# Patient Record
Sex: Female | Born: 1995 | Race: White | Hispanic: No | Marital: Single | Smoking: Never smoker
Health system: Southern US, Community
[De-identification: ages and names within clinical notes are randomized; demographics above are authoritative.]

## PROBLEM LIST (undated history)

## (undated) DIAGNOSIS — F419 Anxiety disorder, unspecified: Secondary | ICD-10-CM

## (undated) DIAGNOSIS — R569 Unspecified convulsions: Secondary | ICD-10-CM

## (undated) DIAGNOSIS — F32A Depression, unspecified: Secondary | ICD-10-CM

## (undated) DIAGNOSIS — F329 Major depressive disorder, single episode, unspecified: Secondary | ICD-10-CM

## (undated) HISTORY — DX: Unspecified convulsions: R56.9

## (undated) HISTORY — DX: Depression, unspecified: F32.A

## (undated) HISTORY — DX: Anxiety disorder, unspecified: F41.9

## (undated) HISTORY — PX: WISDOM TOOTH EXTRACTION: SHX21

---
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
## 2019-05-28 ENCOUNTER — Ambulatory Visit: Payer: BC Managed Care – PPO | Admitting: Family Medicine

## 2019-05-28 ENCOUNTER — Other Ambulatory Visit: Payer: Self-pay

## 2019-05-28 ENCOUNTER — Encounter: Payer: Self-pay | Admitting: Family Medicine

## 2019-05-28 VITALS — BP 123/85 | HR 77 | Temp 98.9°F | Ht 64.0 in | Wt 162.0 lb

## 2019-05-28 DIAGNOSIS — F418 Other specified anxiety disorders: Secondary | ICD-10-CM | POA: Diagnosis not present

## 2019-05-28 DIAGNOSIS — K625 Hemorrhage of anus and rectum: Secondary | ICD-10-CM

## 2019-05-28 MED ORDER — ESCITALOPRAM OXALATE 10 MG PO TABS: 10.0000 mg | ORAL_TABLET | Freq: Every day | ORAL | 1 refills | Status: DC

## 2019-05-28 NOTE — Patient Instructions (Signed)
° ° ° °  If you have lab work done today you will be contacted with your lab results within the next 2 weeks.  If you have not heard from us then please contact us. The fastest way to get your results is to register for My Chart. ° ° °IF you received an x-ray today, you will receive an invoice from [facility]. Please contact [facility] at [phone] with questions or concerns regarding your invoice.  ° °IF you received labwork today, you will receive an invoice from [facility]. Please contact [facility] at [phone] with questions or concerns regarding your invoice.  ° °Our billing staff will not be able to assist you with questions regarding bills from these companies. ° °You will be contacted with the lab results as soon as they are available. The fastest way to get your results is to activate your My Chart account. Instructions are located on the last page of this paperwork. If you have not heard from us regarding the results in 2 weeks, please contact this office. °  ° ° ° °

## 2019-05-28 NOTE — Progress Notes (Signed)
12/18/20201:32 PM  Marilyn Butler [birth year], 23 y.o., female [id number]  Chief Complaint  Patient presents with  . Depression    just moved here from [place], [place] from [place]. Looking to est care. Has depression, taking lexapro. Reports being on the med for about 4 months, beginning was rough with the medicationn now she's good  . Rectal Bleeding    starting to happen more often    HPI:   Patient is a 23 y.o. female with past medical history significant for depression who presents today to establish care  Moved recently from [place] to [place] for work Adjusting ok, has friend in area, boyfriend stationed in [place]  Started to go to therapy in college for anxiety and then started to identify depression being an issue Started on lexapro about 4 months ago She does not want to increase medication, has noted decreased empathy since starting lexapro Has not established with counseling yet Denies active SI  Rectal bleeding, minor pain, for past several months, bright red blood Frequent Has history of hemorrhoids BM overall are regular No nausea, vomiting, abd pain, bloating,  Weight changes, fever, chills, night sweats, no Fhx IBD or colon cancer Depression screen [facility] 2/9 05/28/2019  Decreased Interest 1  Down, Depressed, Hopeless 2  PHQ - 2 Score 3  Altered sleeping 2  Tired, decreased energy 2  Change in appetite 2  Feeling bad or failure about yourself  2  Trouble concentrating 2  Moving slowly or fidgety/restless 2  Suicidal thoughts 1  PHQ-9 Score 16  Difficult doing work/chores Somewhat difficult    No flowsheet data found.   Not on File  Prior to Admission medications   Medication Sig Start Date End Date Taking? Authorizing Provider  escitalopram (LEXAPRO) 10 MG tablet Take 10 mg by mouth daily.   Yes [provider]  etonogestrel (NEXPLANON) 68 MG IMPL implant 1 each by Subdermal route once.   Yes [provider]    Past Medical  History:  Diagnosis Date  . Anxiety   . Depression     History reviewed. No pertinent surgical history.  Social History   Tobacco Use  . Smoking status: Never Smoker  . Smokeless tobacco: Never Used  Substance Use Topics  . Alcohol use: Yes    Family History  Problem Relation Age of Onset  . Mental illness Mother   . Diabetes Father   . Hypertension Father   . Mental illness Sister     Review of Systems  Constitutional: Negative for chills, fever and malaise/fatigue.  Respiratory: Negative for cough and shortness of breath.   Cardiovascular: Negative for chest pain, palpitations and leg swelling.  Gastrointestinal: Positive for blood in stool. Negative for abdominal pain, melena, nausea and vomiting.  Neurological: Negative for dizziness and headaches.   Per hpi  OBJECTIVE:  Today's Vitals   05/28/19 1328  BP: 123/85  Pulse: 77  Temp: 98.9 F (37.2 C)  Weight: 162 lb (73.5 kg)  Height: 5' 4"  (1.626 m)   Body mass index is 27.81 kg/m.   Physical Exam Vitals and nursing note reviewed. Exam conducted with a chaperone present.  Constitutional:      Appearance: She is well-developed.  HENT:     Head: Normocephalic and atraumatic.     Mouth/Throat:     Pharynx: No oropharyngeal exudate.  Eyes:     General: No scleral icterus.    Conjunctiva/sclera: Conjunctivae normal.     Pupils: Pupils are equal, round,  and reactive to light.  Cardiovascular:     Rate and Rhythm: Normal rate and regular rhythm.     Heart sounds: Normal heart sounds. No murmur. No friction rub. No gallop.   Pulmonary:     Effort: Pulmonary effort is normal.     Breath sounds: Normal breath sounds. No wheezing, rhonchi or rales.  Abdominal:     General: Bowel sounds are normal. There is no distension.     Palpations: Abdomen is soft. There is no hepatomegaly, splenomegaly or mass.     Tenderness: There is abdominal tenderness in the right upper quadrant. There is no guarding or rebound.  Negative signs include Murphy's sign.  Genitourinary:    Rectum: No mass, tenderness, anal fissure, external hemorrhoid or internal hemorrhoid. Normal anal tone.  Musculoskeletal:     Cervical back: Neck supple.  Skin:    General: Skin is warm and dry.  Neurological:     Mental Status: She is alert and oriented to person, place, and time.     No results found for this or any previous visit (from the past 24 hour(s)).  No results found.   ASSESSMENT and PLAN  1. Depression with anxiety Improved per patient but not controlled. Patient at this time does not want to increase medication, will establish with counseling. RTC precautions given.  2. Rectal bleeding Unremarkable rectal exam. Referring to GI for further eval and treatment - CBC - Comprehensive metabolic panel - Ambulatory referral to Gastroenterology  Other orders - escitalopram (LEXAPRO) 10 MG tablet; Take 1 tablet (10 mg total) by mouth daily.  Return in about 3 months (around 08/26/2019).    Rutherford Guys, MD [facility] [place], [place] [address] Ph.  [phone] Fax [phone]

## 2019-05-29 LAB — CBC
Hematocrit: 43.6 % (ref 34.0–46.6)
Hemoglobin: 14.7 g/dL (ref 11.1–15.9)
MCH: 32.4 pg (ref 26.6–33.0)
MCHC: 33.7 g/dL (ref 31.5–35.7)
MCV: 96 fL (ref 79–97)
Platelets: 247 10*3/uL (ref 150–450)
RBC: 4.54 x10E6/uL (ref 3.77–5.28)
RDW: 12.5 % (ref 11.7–15.4)
WBC: 7.8 10*3/uL (ref 3.4–10.8)

## 2019-05-29 LAB — COMPREHENSIVE METABOLIC PANEL
ALT: 16 IU/L (ref 0–32)
AST: 17 IU/L (ref 0–40)
Albumin/Globulin Ratio: 2.2 (ref 1.2–2.2)
Albumin: 4.9 g/dL (ref 3.9–5.0)
Alkaline Phosphatase: 96 IU/L (ref 39–117)
BUN/Creatinine Ratio: 19 (ref 9–23)
BUN: 15 mg/dL (ref 6–20)
Bilirubin Total: 0.4 mg/dL (ref 0.0–1.2)
CO2: 23 mmol/L (ref 20–29)
Calcium: 9.3 mg/dL (ref 8.7–10.2)
Chloride: 102 mmol/L (ref 96–106)
Creatinine, Ser: 0.78 mg/dL (ref 0.57–1.00)
GFR calc Af Amer: 123 mL/min/{1.73_m2} (ref >59)
GFR calc non Af Amer: 107 mL/min/{1.73_m2} (ref >59)
Globulin, Total: 2.2 g/dL (ref 1.5–4.5)
Glucose: 84 mg/dL (ref 65–99)
Potassium: 4.2 mmol/L (ref 3.5–5.2)
Sodium: 139 mmol/L (ref 134–144)
Total Protein: 7.1 g/dL (ref 6.0–8.5)

## 2019-06-14 DIAGNOSIS — F329 Major depressive disorder, single episode, unspecified: Secondary | ICD-10-CM | POA: Insufficient documentation

## 2019-06-14 DIAGNOSIS — K625 Hemorrhage of anus and rectum: Secondary | ICD-10-CM | POA: Insufficient documentation

## 2019-06-14 DIAGNOSIS — F32A Depression, unspecified: Secondary | ICD-10-CM | POA: Insufficient documentation

## 2019-06-23 ENCOUNTER — Ambulatory Visit: Payer: Self-pay | Admitting: Physician Assistant

## 2019-06-30 ENCOUNTER — Encounter: Payer: Self-pay | Admitting: Obstetrics & Gynecology

## 2019-06-30 ENCOUNTER — Ambulatory Visit: Payer: BC Managed Care – PPO | Admitting: Obstetrics & Gynecology

## 2019-06-30 ENCOUNTER — Other Ambulatory Visit: Payer: Self-pay

## 2019-06-30 VITALS — BP 122/79 | HR 76 | Wt 164.7 lb

## 2019-06-30 DIAGNOSIS — Z23 Encounter for immunization: Secondary | ICD-10-CM

## 2019-06-30 DIAGNOSIS — Z01419 Encounter for gynecological examination (general) (routine) without abnormal findings: Secondary | ICD-10-CM

## 2019-06-30 NOTE — Patient Instructions (Signed)
Preventive Care 21-25 Years Old, Female Preventive care refers to visits with your health care provider and lifestyle choices that can promote health and wellness. This includes:  A yearly physical exam. This may also be called an annual well check.  Regular dental visits and eye exams.  Immunizations.  Screening for certain conditions.  Healthy lifestyle choices, such as eating a healthy diet, getting regular exercise, not using drugs or products that contain nicotine and tobacco, and limiting alcohol use. What can I expect for my preventive care visit? Physical exam Your health care provider will check your:  Height and weight. This may be used to calculate body mass index (BMI), which tells if you are at a healthy weight.  Heart rate and blood pressure.  Skin for abnormal spots. Counseling Your health care provider may ask you questions about your:  Alcohol, tobacco, and drug use.  Emotional well-being.  Home and relationship well-being.  Sexual activity.  Eating habits.  Work and work environment.  Method of birth control.  Menstrual cycle.  Pregnancy history. What immunizations do I need?  Influenza (flu) vaccine  This is recommended every year. Tetanus, diphtheria, and pertussis (Tdap) vaccine  You may need a Td booster every 10 years. Varicella (chickenpox) vaccine  You may need this if you have not been vaccinated. Human papillomavirus (HPV) vaccine  If recommended by your health care provider, you may need three doses over 6 months. Measles, mumps, and rubella (MMR) vaccine  You may need at least one dose of MMR. You may also need a second dose. Meningococcal conjugate (MenACWY) vaccine  One dose is recommended if you are age 19-21 years and a first-year college student living in a residence hall, or if you have one of several medical conditions. You may also need additional booster doses. Pneumococcal conjugate (PCV13) vaccine  You may need  this if you have certain conditions and were not previously vaccinated. Pneumococcal polysaccharide (PPSV23) vaccine  You may need one or two doses if you smoke cigarettes or if you have certain conditions. Hepatitis A vaccine  You may need this if you have certain conditions or if you travel or work in places where you may be exposed to hepatitis A. Hepatitis B vaccine  You may need this if you have certain conditions or if you travel or work in places where you may be exposed to hepatitis B. Haemophilus influenzae type b (Hib) vaccine  You may need this if you have certain conditions. You may receive vaccines as individual doses or as more than one vaccine together in one shot (combination vaccines). Talk with your health care provider about the risks and benefits of combination vaccines. What tests do I need?  Blood tests  Lipid and cholesterol levels. These may be checked every 5 years starting at age 20.  Hepatitis C test.  Hepatitis B test. Screening  Diabetes screening. This is done by checking your blood sugar (glucose) after you have not eaten for a while (fasting).  Sexually transmitted disease (STD) testing.  BRCA-related cancer screening. This may be done if you have a family history of breast, ovarian, tubal, or peritoneal cancers.  Pelvic exam and Pap test. This may be done every 3 years starting at age 21. Starting at age 30, this may be done every 5 years if you have a Pap test in combination with an HPV test. Talk with your health care provider about your test results, treatment options, and if necessary, the need for more tests.   Follow these instructions at home: Eating and drinking   Eat a diet that includes fresh fruits and vegetables, whole grains, lean protein, and low-fat dairy.  Take vitamin and mineral supplements as recommended by your health care provider.  Do not drink alcohol if: ? Your health care provider tells you not to drink. ? You are  pregnant, may be pregnant, or are planning to become pregnant.  If you drink alcohol: ? Limit how much you have to 0-1 drink a day. ? Be aware of how much alcohol is in your drink. In the [place] one drink equals one 12 oz bottle of beer (355 mL), one 5 oz glass of wine (148 mL), or one 1 oz glass of hard liquor (44 mL). Lifestyle  Take daily care of your teeth and gums.  Stay active. Exercise for at least 30 minutes on 5 or more days each week.  Do not use any products that contain nicotine or tobacco, such as cigarettes, e-cigarettes, and chewing tobacco. If you need help quitting, ask your health care provider.  If you are sexually active, practice safe sex. Use a condom or other form of birth control (contraception) in order to prevent pregnancy and STIs (sexually transmitted infections). If you plan to become pregnant, see your health care provider for a preconception visit. What's next?  Visit your health care provider once a year for a well check visit.  Ask your health care provider how often you should have your eyes and teeth checked.  Stay up to date on all vaccines. This information is not intended to replace advice given to you by your health care provider. Make sure you discuss any questions you have with your health care provider. Document Revised: 02/05/2018 Document Reviewed: 02/05/2018 Elsevier Patient Education  2020 [employer].

## 2019-06-30 NOTE — Progress Notes (Signed)
Nexplanon placed February 2020 per pt. Last PAP smear January 2020; normal per pt. Positive GAD-7; offered [facility] services, pt states she recently started with a new counselor and medication is managed by PCP.  Apolonio Schneiders RN 06/30/19

## 2019-06-30 NOTE — Progress Notes (Signed)
GYNECOLOGY ANNUAL PREVENTATIVE CARE ENCOUNTER NOTE  History:     Marilyn Butler is a 24 y.o. G7 female here for a routine annual gynecologic exam and to establish care.  Current significant complaints: left lower abdominal pain noticed on palpation of area during exam, no pain at rest.   Denies abnormal vaginal bleeding, discharge, other pelvic pain, problems with intercourse or other gynecologic concerns.    Gynecologic History Patient's last menstrual period was 04/10/2019 (approximate). Contraception: Nexplanon placed February 2020 Last Pap: January 2020. Results were: normal   Obstetric History OB History  Gravida Para Term Preterm AB Living  0 0 0 0 0 0  SAB TAB Ectopic Multiple Live Births  0 0 0 0 0    Past Medical History:  Diagnosis Date  . Anxiety   . Depression     History reviewed. No pertinent surgical history.  Current Outpatient Medications on File Prior to Visit  Medication Sig Dispense Refill  . escitalopram (LEXAPRO) 10 MG tablet Take 1 tablet (10 mg total) by mouth daily. 90 tablet 1  . etonogestrel (NEXPLANON) 68 MG IMPL implant 1 each by Subdermal route once.    Marland Kitchen ibuprofen (ADVIL) 400 MG tablet Take 400 mg by mouth every 6 (six) hours as needed.     No current facility-administered medications on file prior to visit.    Not on File  Social History:  reports that she has never smoked. She has never used smokeless tobacco. She reports current alcohol use. She reports that she does not use drugs.  Family History  Problem Relation Age of Onset  . Mental illness Mother   . Diabetes Father   . Hypertension Father   . Mental illness Sister     The following portions of the patient's history were reviewed and updated as appropriate: allergies, current medications, past family history, past medical history, past social history, past surgical history and problem list.  Review of Systems Pertinent items noted in HPI and remainder of comprehensive ROS  otherwise negative.  Physical Exam:  BP 122/79   Pulse 76   Wt 164 lb 11.2 oz (74.7 kg)   LMP 04/10/2019 (Approximate)   BMI 28.27 kg/m  CONSTITUTIONAL: Well-developed, well-nourished female in no acute distress.  HENT:  Normocephalic, atraumatic, External right and left ear normal. Oropharynx is clear and moist EYES: Conjunctivae and EOM are normal. Pupils are equal, round, and reactive to light. No scleral icterus.  NECK: Normal range of motion, supple, no masses.  Normal thyroid.  SKIN: Skin is warm and dry. No rash noted. Not diaphoretic. No erythema. No pallor. MUSCULOSKELETAL: Normal range of motion. No tenderness.  No cyanosis, clubbing, or edema.  2+ distal pulses. NEUROLOGIC: Alert and oriented to person, place, and time. Normal reflexes, muscle tone coordination.  PSYCHIATRIC: Normal mood and affect. Normal behavior. Normal judgment and thought content. CARDIOVASCULAR: Normal heart rate noted, regular rhythm RESPIRATORY: Clear to auscultation bilaterally. Effort and breath sounds normal, no problems with respiration noted. BREASTS: Symmetric in size. No masses, tenderness, skin changes, nipple drainage, or lymphadenopathy bilaterally. ABDOMEN: Soft, no distention noted.  Mild left lower abdominal tenderness to palpation, no rebound or guarding.  PELVIC: Deferred   Assessment and Plan:      1. Need for influenza vaccination - Flu Vaccine QUAD 36+ mos IM done  2. Well woman exam with routine gynecological exam Up-to-date on pap smear Normal breast exam She was told to call for any worsening pelvic pain or other gynecologic  concerns; she declined evaluation with infection screening or ultrasound for now. Routine preventative health maintenance measures emphasized. Please refer to After Visit Summary for other counseling recommendations.      Verita Schneiders, MD, [facility] [employer], [place]

## 2019-07-01 ENCOUNTER — Ambulatory Visit (INDEPENDENT_AMBULATORY_CARE_PROVIDER_SITE_OTHER): Payer: BC Managed Care – PPO | Admitting: Physician Assistant

## 2019-07-01 ENCOUNTER — Encounter: Payer: Self-pay | Admitting: Physician Assistant

## 2019-07-01 VITALS — BP 108/68 | HR 88 | Temp 98.7°F | Ht 63.75 in | Wt 165.5 lb

## 2019-07-01 DIAGNOSIS — R112 Nausea with vomiting, unspecified: Secondary | ICD-10-CM

## 2019-07-01 DIAGNOSIS — Z01818 Encounter for other preprocedural examination: Secondary | ICD-10-CM | POA: Diagnosis not present

## 2019-07-01 DIAGNOSIS — R109 Unspecified abdominal pain: Secondary | ICD-10-CM | POA: Diagnosis not present

## 2019-07-01 DIAGNOSIS — K625 Hemorrhage of anus and rectum: Secondary | ICD-10-CM | POA: Diagnosis not present

## 2019-07-01 MED ORDER — NA SULFATE-K SULFATE-MG SULF 17.5-3.13-1.6 GM/177ML PO SOLN: 1.0000 | Freq: Once | ORAL | 0 refills | Status: AC

## 2019-07-01 NOTE — Patient Instructions (Signed)
If you are age 25 or older, your body mass index should be between 23-30. Your Body mass index is 28.63 kg/m. If this is out of the aforementioned range listed, please consider follow up with your Primary Care Provider.  If you are age 29 or younger, your body mass index should be between 19-25. Your Body mass index is 28.63 kg/m. If this is out of the aformentioned range listed, please consider follow up with your Primary Care Provider.   You have been scheduled for a colonoscopy. Please follow written instructions given to you at your visit today.  Please pick up your prep supplies at the pharmacy within the next 1-3 days. If you use inhalers (even only as needed), please bring them with you on the day of your procedure.

## 2019-07-01 NOTE — Progress Notes (Signed)
Chief Complaint: Rectal bleeding  HPI:    Marilyn Butler is a 24 year old female with a past medical history as listed below including anxiety and depression, who was referred to me by Rutherford Guys, MD for a complaint of rectal bleeding.      05/28/2019 patient saw PCP and describes some rectal bleeding which was starting to happen more often.  Rectal exam at that time was unremarkable and patient was referred to our clinic.  Labs that day showed a CBC which was normal and a CMP which was normal.    Today, the patient explains that over the past couple of months she has started to notice some bloody stool and tells me this is "not just a little bit of bright red blood seen in the toilet paper".  She describes this mixed in with a solid stool most of the time when it occurs, when it happens it can last for a few days but it is very sporadic.  In fact, she has not seen any since a day after her appointment with PCP as above.  Denies any rectal pain during these episodes.    Along with above patient describes some lower abdominal pain, apparently saw OB/GYN yesterday who thought this may be due to ovulation.  Tells me she gets this pain off and on.  Describes a sister with IBS.    Also describes some random episodes of nausea and vomiting with diarrhea.  Tells me that every 2 or 3 months she will go to sleep and an hour after being asleep will wake back up nauseous, run to the bathroom and vomit and have an episode of diarrhea and then go back to sleep.  She has never kept a food journal or noticed if this is after eating late at night.  She is a vegetarian.    Works as a Corporate treasurer.    Denies fever, chills, weight loss, heartburn or reflux.  Past Medical History:  Diagnosis Date  . Anxiety   . Depression     No past surgical history on file.  Current Outpatient Medications  Medication Sig Dispense Refill  . escitalopram (LEXAPRO) 10 MG tablet Take 1 tablet (10 mg total) by mouth daily. 90  tablet 1  . etonogestrel (NEXPLANON) 68 MG IMPL implant 1 each by Subdermal route once.    Marland Kitchen ibuprofen (ADVIL) 400 MG tablet Take 400 mg by mouth every 6 (six) hours as needed.     No current facility-administered medications for this visit.    Allergies as of 07/01/2019  . (Not on File)    Family History  Problem Relation Age of Onset  . Mental illness Mother   . Diabetes Father   . Hypertension Father   . Mental illness Sister     Social History   Socioeconomic History  . Marital status: Single    Spouse name: Not on file  . Number of children: Not on file  . Years of education: Not on file  . Highest education level: Not on file  Occupational History  . Not on file  Tobacco Use  . Smoking status: Never Smoker  . Smokeless tobacco: Never Used  Substance and Sexual Activity  . Alcohol use: Yes  . Drug use: Never  . Sexual activity: Yes    Birth control/protection: Implant  Other Topics Concern  . Not on file  Social History Narrative  . Not on file   Social Determinants of Health   Financial Resource  Strain:   . Difficulty of Paying Living Expenses: Not on file  Food Insecurity:   . Worried About Charity fundraiser in the Last Year: Not on file  . Ran Out of Food in the Last Year: Not on file  Transportation Needs:   . Lack of Transportation (Medical): Not on file  . Lack of Transportation (Non-Medical): Not on file  Physical Activity:   . Days of Exercise per Week: Not on file  . Minutes of Exercise per Session: Not on file  Stress:   . Feeling of Stress : Not on file  Social Connections:   . Frequency of Communication with Friends and Family: Not on file  . Frequency of Social Gatherings with Friends and Family: Not on file  . Attends Religious Services: Not on file  . Active Member of Clubs or Organizations: Not on file  . Attends Archivist Meetings: Not on file  . Marital Status: Not on file  Intimate Partner Violence:   . Fear of  Current or Ex-Partner: Not on file  . Emotionally Abused: Not on file  . Physically Abused: Not on file  . Sexually Abused: Not on file    Review of Systems:    Constitutional: No weight loss, fever or chills Skin: No rash  Cardiovascular: No chest pain Respiratory: No SOB  Gastrointestinal: See HPI and otherwise negative Genitourinary: No dysuria Neurological: No headache Musculoskeletal: No new muscle or joint pain Hematologic: No bruising Psychiatric: +anxiety and depression   Physical Exam:  Vital signs: BP 108/68 (BP Location: Left Arm, Patient Position: Sitting, Cuff Size: Normal)   Pulse 88   Temp 98.7 F (37.1 C)   Ht 5' 3.75" (1.619 m) Comment: height measured without shoes  Wt 165 lb 8 oz (75.1 kg)   BMI 28.63 kg/m   Constitutional:   Pleasant Caucasian female appears to be in NAD, Well developed, Well nourished, alert and cooperative Head:  Normocephalic and atraumatic. Eyes:   PEERL, EOMI. No icterus. Conjunctiva pink. Ears:  Normal auditory acuity. Neck:  Supple Throat: Oral cavity and pharynx without inflammation, swelling or lesion.  Respiratory: Respirations even and unlabored. Lungs clear to auscultation bilaterally.   No wheezes, crackles, or rhonchi.  Cardiovascular: Normal S1, S2. No MRG. Regular rate and rhythm. No peripheral edema, cyanosis or pallor.  Gastrointestinal:  Soft, nondistended, Mild RLQ ttp. No rebound or guarding. Normal bowel sounds. No appreciable masses or hepatomegaly. Rectal:  Not performed.  Msk:  Symmetrical without gross deformities. Without edema, no deformity or joint abnormality.  Neurologic:  Alert and  oriented x4;  grossly normal neurologically.  Skin:   Dry and intact without significant lesions or rashes. Psychiatric: Demonstrates good judgement and reason without abnormal affect or behaviors.  MOST RECENT LABS AND IMAGING: CBC    Component Value Date/Time   WBC 7.8 05/28/2019 1429   RBC 4.54 05/28/2019 1429   HGB  14.7 05/28/2019 1429   HCT 43.6 05/28/2019 1429   PLT 247 05/28/2019 1429   MCV 96 05/28/2019 1429   MCH 32.4 05/28/2019 1429   MCHC 33.7 05/28/2019 1429   RDW 12.5 05/28/2019 1429    CMP     Component Value Date/Time   NA 139 05/28/2019 1429   K 4.2 05/28/2019 1429   CL 102 05/28/2019 1429   CO2 23 05/28/2019 1429   GLUCOSE 84 05/28/2019 1429   BUN 15 05/28/2019 1429   CREATININE 0.78 05/28/2019 1429   CALCIUM 9.3 05/28/2019 1429  PROT 7.1 05/28/2019 1429   ALBUMIN 4.9 05/28/2019 1429   AST 17 05/28/2019 1429   ALT 16 05/28/2019 1429   ALKPHOS 96 05/28/2019 1429   BILITOT 0.4 05/28/2019 1429   GFRNONAA 107 05/28/2019 1429   GFRAA 123 05/28/2019 1429    Assessment: 1.  Hematochezia: Can occur for a few days at a time, has not happened in the past month, rectal exam at time of last bleeding was unrevealing per PCP; consider hemorrhoids versus IBD versus other 2.  Abdominal pain: At random times per patient, thought possibly ovulation by OB/GYN; consider ovulation versus IBS 3.  Nausea and vomiting: Occurs every 2 to 3 months an hour after patient goes to sleep to wake up and have an episode of nausea and vomiting with diarrhea; consider most likely gastritis/reflux versus cyclical vomiting  Plan: 1.  Discussed doing an anoscopy with the patient today.  She declines. 2.  Recommend the patient have a colonoscopy for further evaluation of her abdominal pain and hematochezia.  This was scheduled with Dr. Loletha Carrow as he is supervising this afternoon in the [facility].  Did discuss risks, benefits, limitations and alternatives and the patient agrees to proceed.  She will be Covid tested 2 days prior to time of procedure. 3.  Discussed nausea and vomiting episodes with the patient, would recommend that she keep a food journal for [place] to better decide what is causing these episodes as they only occur every 2 to 3 months.  Did discuss the possibility of reflux.  Reviewed antireflux lifestyle  modifications and diet recommendations including waiting 3 to 4 hours after eating before laying down at night. 4.  Patient will follow in clinic per recommendations from Dr. Loletha Carrow after time of procedure.  Ellouise Newer, PA-C [facility] 07/01/2019, 2:31 PM  Cc: Rutherford Guys, MD

## 2019-07-05 NOTE — Progress Notes (Signed)
____________________________________________________________  Attending physician addendum:  Thank you for sending this case to me. I have reviewed the entire note, and the outlined plan seems appropriate.  Difficult to characterize symptoms.  Wilfrid Lund, MD  ____________________________________________________________

## 2019-07-16 DIAGNOSIS — F419 Anxiety disorder, unspecified: Secondary | ICD-10-CM | POA: Diagnosis not present

## 2019-07-26 ENCOUNTER — Other Ambulatory Visit: Payer: Self-pay | Admitting: Gastroenterology

## 2019-07-26 ENCOUNTER — Ambulatory Visit (INDEPENDENT_AMBULATORY_CARE_PROVIDER_SITE_OTHER): Payer: BC Managed Care – PPO

## 2019-07-26 ENCOUNTER — Encounter: Payer: Self-pay | Admitting: Gastroenterology

## 2019-07-26 DIAGNOSIS — Z1159 Encounter for screening for other viral diseases: Secondary | ICD-10-CM | POA: Diagnosis not present

## 2019-07-27 LAB — SARS CORONAVIRUS 2 (TAT 6-24 HRS): SARS Coronavirus 2: NEGATIVE

## 2019-07-28 ENCOUNTER — Ambulatory Visit (AMBULATORY_SURGERY_CENTER): Payer: BC Managed Care – PPO | Admitting: Gastroenterology

## 2019-07-28 ENCOUNTER — Other Ambulatory Visit: Payer: Self-pay

## 2019-07-28 ENCOUNTER — Encounter: Payer: Self-pay | Admitting: Gastroenterology

## 2019-07-28 VITALS — BP 117/69 | HR 67 | Temp 96.2°F | Resp 19 | Ht 63.75 in | Wt 165.0 lb

## 2019-07-28 DIAGNOSIS — K625 Hemorrhage of anus and rectum: Secondary | ICD-10-CM

## 2019-07-28 DIAGNOSIS — R109 Unspecified abdominal pain: Secondary | ICD-10-CM

## 2019-07-28 DIAGNOSIS — K633 Ulcer of intestine: Secondary | ICD-10-CM

## 2019-07-28 MED ORDER — SODIUM CHLORIDE 0.9 % IV SOLN: 500.0000 mL | Freq: Once | INTRAVENOUS | Status: AC

## 2019-07-28 NOTE — Progress Notes (Signed)
Called to room to assist during endoscopic procedure.  Patient ID and intended procedure confirmed with present staff. Received instructions for my participation in the procedure from the performing physician.  

## 2019-07-28 NOTE — Progress Notes (Signed)
To PACU, VSS. Report to [email or web address]

## 2019-07-28 NOTE — Op Note (Signed)
[place] Patient Name: Marilyn Butler Procedure Date: 07/28/2019 12:24 PM MRN: [id number] Endoscopist: Menno. Loletha Carrow , MD Age: 24 Referring MD:  Date of Birth: 31-Dec-1995 Gender: Female Account #: [id number] Procedure:                Colonoscopy Indications:              Rectal bleeding (intermittent, approximately 6                            months) Medicines:                Monitored Anesthesia Care Procedure:                Pre-Anesthesia Assessment:                           - Prior to the procedure, a History and Physical                            was performed, and patient medications and                            allergies were reviewed. The patient's tolerance of                            previous anesthesia was also reviewed. The risks                            and benefits of the procedure and the sedation                            options and risks were discussed with the patient.                            All questions were answered, and informed consent                            was obtained. Prior Anticoagulants: The patient has                            taken no previous anticoagulant or antiplatelet                            agents. ASA Grade Assessment: II - A patient with                            mild systemic disease. After reviewing the risks                            and benefits, the patient was deemed in                            satisfactory condition to undergo the procedure.  After obtaining informed consent, the colonoscope                            was passed under direct vision. Throughout the                            procedure, the patient's blood pressure, pulse, and                            oxygen saturations were monitored continuously. The                            Colonoscope was introduced through the anus and                            advanced to the the terminal ileum, with            identification of the appendiceal orifice and IC                            valve. The colonoscopy was performed without                            difficulty. The patient tolerated the procedure                            well. The quality of the bowel preparation was                            good. The terminal ileum, ileocecal valve,                            appendiceal orifice, and rectum were photographed. Scope In: 12:37:30 PM Scope Out: 12:48:43 PM Scope Withdrawal Time: 0 hours 7 minutes 48 seconds  Total Procedure Duration: 0 hours 11 minutes 13 seconds  Findings:                 The perianal and digital rectal examinations were                            normal. Specifically, no fistula seen.                           The terminal ileum contained multiple patchy                            non-bleeding aphthous ulcers with inflammation.                            Biopsies were taken with a cold forceps for                            histology.                           The entire examined  colon appeared normal on direct                            and retroflexion views. Complications:            No immediate complications. Estimated Blood Loss:     Estimated blood loss: none. Estimated blood loss                            was minimal. 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 Biopsied.                           - The entire examined colon is normal on direct and                            retroflexion views.                           The ileal findings appear more like NSAID effect                            than Crohn's disease, and are not sufficient to                            explain overt lower GI bleeding. Benign anal                            bleeding. Recommendation:           - Patient has a contact number available for                            emergencies. The signs and symptoms of potential                            delayed  complications were discussed with the                            patient. Return to normal activities tomorrow.                            Written discharge instructions were provided to the                            patient.                           - Resume previous diet.                           - Continue present medications.                           - Await pathology results. Depending on results, CT  enterography may be warranted. Charizma Gardiner L. Loletha Carrow, MD 07/28/2019 12:59:17 PM This report has been signed electronically.

## 2019-07-28 NOTE — Progress Notes (Signed)
Temp-JB VS-DT

## 2019-07-28 NOTE — Patient Instructions (Signed)
YOU HAD AN ENDOSCOPIC PROCEDURE TODAY AT [facility]:   Refer to the procedure report that was given to you for any specific questions about what was found during the examination.  If the procedure report does not answer your questions, please call your gastroenterologist to clarify.  If you requested that your care partner not be given the details of your procedure findings, then the procedure report has been included in a sealed envelope for you to review at your convenience later.  YOU SHOULD EXPECT: Some feelings of bloating in the abdomen. Passage of more gas than usual.  Walking can help get rid of the air that was put into your GI tract during the procedure and reduce the bloating. If you had a lower endoscopy (such as a colonoscopy or flexible sigmoidoscopy) you may notice spotting of blood in your stool or on the toilet paper. If you underwent a bowel prep for your procedure, you may not have a normal bowel movement for a few days.  Please Note:  You might notice some irritation and congestion in your nose or some drainage.  This is from the oxygen used during your procedure.  There is no need for concern and it should clear up in a day or so.  SYMPTOMS TO REPORT IMMEDIATELY:   Following lower endoscopy (colonoscopy or flexible sigmoidoscopy):  Excessive amounts of blood in the stool  Significant tenderness or worsening of abdominal pains  Swelling of the abdomen that is new, acute  Fever of 100F or higher   For urgent or emergent issues, a gastroenterologist can be reached at any hour by calling [phone].   DIET:  We do recommend a small meal at first, but then you may proceed to your regular diet.  Drink plenty of fluids but you should avoid alcoholic beverages for 24 hours.  MEDICATIONS: Continue present medications.  FOLLOW UP: We will await your pathology results. Depending on these results, CT enterography may be warranted.  Please see handouts given to  you by your recovery nurse.  ACTIVITY:  You should plan to take it easy for the rest of today and you should NOT DRIVE or use heavy machinery until tomorrow (because of the sedation medicines used during the test).    FOLLOW UP: Our staff will call the number listed on your records 48-72 hours following your procedure to check on you and address any questions or concerns that you may have regarding the information given to you following your procedure. If we do not reach you, we will leave a message.  We will attempt to reach you two times.  During this call, we will ask if you have developed any symptoms of COVID 19. If you develop any symptoms (ie: fever, flu-like symptoms, shortness of breath, cough etc.) before then, please call [phone].  If you test positive for Covid 19 in the 2 weeks post procedure, please call and report this information to [place].    If any biopsies were taken you will be contacted by phone or by letter within the next 1-3 weeks.  Please call us at [phone] if you have not heard about the biopsies in 3 weeks.   Thank you for allowing [place] to provide for your healthcare needs today.   SIGNATURES/CONFIDENTIALITY: You and/or your care partner have signed paperwork which will be entered into your electronic medical record.  These signatures attest to the fact that that the information above on your After Visit Summary has been  reviewed and is understood.  Full responsibility of the confidentiality of this discharge information lies with you and/or your care-partner.

## 2019-07-30 ENCOUNTER — Telehealth: Payer: Self-pay | Admitting: *Deleted

## 2019-07-30 NOTE — Telephone Encounter (Signed)
  Follow up Call-  Call back number 07/28/2019  Post procedure Call Back phone  # [phone]  Permission to leave phone message Yes     Patient questions:  Do you have a fever, pain , or abdominal swelling? No. Pain Score  0 *  Have you tolerated food without any problems? Yes.    Have you been able to return to your normal activities? Yes.    Do you have any questions about your discharge instructions: Diet   No. Medications  No. Follow up visit  No.  Do you have questions or concerns about your Care? No.  Actions: * If pain score is 4 or above: No action needed, pain <4.  1. Have you developed a fever since your procedure? no  2.   Have you had an respiratory symptoms (SOB or cough) since your procedure? no  3.   Have you tested positive for COVID 19 since your procedure no  4.   Have you had any family members/close contacts diagnosed with the COVID 19 since your procedure?  no   If yes to any of these questions please route to Joylene John, RN and Alphonsa Gin, Therapist, sports.

## 2019-08-04 ENCOUNTER — Other Ambulatory Visit: Payer: Self-pay | Admitting: *Deleted

## 2019-08-04 DIAGNOSIS — K625 Hemorrhage of anus and rectum: Secondary | ICD-10-CM

## 2019-08-04 DIAGNOSIS — R109 Unspecified abdominal pain: Secondary | ICD-10-CM

## 2019-08-18 ENCOUNTER — Telehealth: Payer: Self-pay

## 2019-08-18 ENCOUNTER — Ambulatory Visit (HOSPITAL_COMMUNITY): Admission: RE | Admit: 2019-08-18 | Payer: BC Managed Care – PPO | Source: Ambulatory Visit

## 2019-08-18 NOTE — Telephone Encounter (Signed)
Understood, thanks  I expect she will reschedule when she feels ready to do so.

## 2019-08-18 NOTE — Telephone Encounter (Signed)
Incoming fax from [facility] radiology. Pt cancelled her CT enterography for today 08-18-2019. She did not reschedule at this time.

## 2019-08-20 ENCOUNTER — Ambulatory Visit: Payer: BC Managed Care – PPO | Admitting: Family Medicine

## 2019-08-25 DIAGNOSIS — F419 Anxiety disorder, unspecified: Secondary | ICD-10-CM | POA: Diagnosis not present

## 2019-09-01 ENCOUNTER — Other Ambulatory Visit: Payer: Self-pay

## 2019-09-01 ENCOUNTER — Ambulatory Visit: Payer: BC Managed Care – PPO | Admitting: Gastroenterology

## 2019-09-01 ENCOUNTER — Encounter: Payer: Self-pay | Admitting: Gastroenterology

## 2019-09-01 VITALS — BP 110/70 | HR 88 | Temp 99.2°F | Ht 63.75 in | Wt 164.1 lb

## 2019-09-01 DIAGNOSIS — E739 Lactose intolerance, unspecified: Secondary | ICD-10-CM

## 2019-09-01 DIAGNOSIS — K5000 Crohn's disease of small intestine without complications: Secondary | ICD-10-CM

## 2019-09-01 DIAGNOSIS — K589 Irritable bowel syndrome without diarrhea: Secondary | ICD-10-CM | POA: Diagnosis not present

## 2019-09-01 NOTE — Patient Instructions (Addendum)
If you are age 25 or older, your body mass index should be between 23-30. Your Body mass index is 28.39 kg/m. If this is out of the aforementioned range listed, please consider follow up with your Primary Care Provider.  If you are age 48 or younger, your body mass index should be between 19-25. Your Body mass index is 28.39 kg/m. If this is out of the aformentioned range listed, please consider follow up with your Primary Care Provider.   Follow up as needed.   It was a pleasure to see you today!  Dr. Loletha Carrow     Food Guidelines for those with chronic digestive trouble:  Many people have difficulty digesting certain foods, causing a variety of distressing and embarrassing symptoms such as abdominal pain, bloating and gas.  These foods may need to be avoided or consumed in small amounts.  Here are some tips that might be helpful for you.  1.   Lactose intolerance is the difficulty or complete inability to digest lactose, the natural sugar in milk and anything made from milk.  This condition is harmless, common, and can begin any time during life.  Some people can digest a modest amount of lactose while others cannot tolerate any.  Also, not all dairy products contain equal amounts of lactose.  For example, hard cheeses such as parmesan have less lactose than soft cheeses such as cheddar.  Yogurt has less lactose than milk or cheese.  Many packaged foods (even many brands of bread) have milk, so read ingredient lists carefully.  It is difficult to test for lactose intolerance, so just try avoiding lactose as much as possible for a week and see what happens with your symptoms.  If you seem to be lactose intolerant, the best plan is to avoid it (but make sure you get calcium from another source).  The next best thing is to use lactase enzyme supplements, available over the counter everywhere.  Just know that many lactose intolerant people need to take several tablets with each serving of dairy to avoid  symptoms.  Lastly, a lot of restaurant food is made with milk or butter.  Many are things you might not suspect, such as mashed potatoes, rice and pasta (cooked with butter) and "grilled" items.  If you are lactose intolerant, it never hurts to ask your server what has milk or butter.  2.   Fiber is an important part of your diet, but not all fiber is well-tolerated.  Insoluble fiber such as bran is often consumed by normal gut bacteria and converted into gas.  Soluble fiber such as oats, squash, carrots and green beans are typically tolerated better.  3.   Some types of carbohydrates can be poorly digested.  Examples include: fructose (apples, cherries, pears, raisins and other dried fruits), fructans (onions, zucchini, large amounts of wheat), sorbitol/mannitol/xylitol and sucralose/Splenda (common artificial sweeteners), and raffinose (lentils, broccoli, cabbage, asparagus, brussel sprouts, many types of beans).  Do a Development worker, community for [employer] and you will find helpful information. Beano, a dietary supplement, will often help with raffinose-containing foods.  As with lactase tablets, you may need several per serving.  4.   Whenever possible, avoid processed food&meats and chemical additives.  High fructose corn syrup, a common sweetener, may be difficult to digest.  Eggs and soy (comes from the soybean, and added to many foods now) are other common bloating/gassy foods.  5.  Regarding gluten:  gluten is a protein mainly found in wheat, but  also rye and barley.  There is a condition called celiac sprue, which is an inflammatory reaction in the small intestine causing a variety of digestive symptoms.  Blood testing is highly reliable to look for this condition, and sometimes upper endoscopy with small bowel biopsies may be necessary to make the diagnosis.  Many patients who test negative for celiac sprue report improvement in their digestive symptoms when they switch to a gluten-free diet.  However, in  these "non-celiac gluten sensitive" patients, the true role of gluten in their symptoms is unclear.  Reducing carbohydrates in general may decrease the gas and bloating caused when gut bacteria consume carbs. Also, some of these patients may actually be intolerant of the baker's yeast in bread products rather than the gluten.  Flatbread and other reduced yeast breads might therefore be tolerated.  There is no specific testing available for most food intolerances, which are discovered mainly by dietary elimination.  Please do not embark on a gluten free diet unless directed by your doctor, as it is highly restrictive, and may lead to nutritional deficiencies if not carefully monitored.  Lastly, beware of internet claims offering "personalized" tests for food intolerances.  Such testing has no reliable scientific evidence to support its reliability and correlation to symptoms.    6.  The best advice is old advice, especially for those with chronic digestive trouble - try to eat "clean".  Balanced diet, avoid processed food, plenty of fruits and vegetables, cut down the sugar, minimal alcohol, avoid tobacco. Make time to care for yourself, get enough sleep, exercise when you can, reduce stress.  Your guts will thank you for it.   - Dr. [facility]

## 2019-09-01 NOTE — Progress Notes (Signed)
     [facility] Progress Note  Chief Complaint: Rectal bleeding  Subjective  History: Office consult in January for intermittent rectal bleeding, episodic nausea and vomiting with diarrhea, typically occurring every few months.  Some lower abdominal pelvic pain evaluated by OB/GYN, thought possibly gynecologic. Colonoscopy 07/28/2019: No clear source of bleeding seen, appear to be benign anal bleeding.  Terminal ileum had multiple aphthous ulcers, unclear if perhaps NSAID related or Crohn's disease.  She reported very uncommon NSAID use, biopsies did not show definitive findings suggesting Crohn's.  CT enterography was recommended and scheduled about 2 weeks ago, but she decided not to have it done due to expense.Marilyn Butler has since learned that her father may have Crohn's disease.  Her sister also reportedly has IBS.  Marilyn Butler tells me she has been feeling better overall since the colonoscopy.  There is been no bleeding since then.  Prior to that, it would occur perhaps every 2 or 3 months, and usually with loose stool rather than constipation.  She stop using NSAIDs, taking Tylenol instead for intermittent menstrual pain.  Overall she has felt well, her appetite is good and her weight generally stable.  Sometimes she has some lower abdominal cramps and urgency for loose stool, perhaps triggered by certain foods such as lactose, or may be with some stress.  ROS: Cardiovascular:  no chest pain Respiratory: no dyspnea  The patient's Past Medical, Family and Social History were reviewed and are on file in the EMR.  Objective:  Med list reviewed  Current Outpatient Medications:  .  escitalopram (LEXAPRO) 10 MG tablet, Take 1 tablet (10 mg total) by mouth daily., Disp: 90 tablet, Rfl: 1 .  etonogestrel (NEXPLANON) 68 MG IMPL implant, 1 each by Subdermal route once., Disp: , Rfl:  .  ibuprofen (ADVIL) 400 MG tablet, Take 400 mg by mouth as needed. , Disp: , Rfl:   Current Facility-Administered  Medications:  .  0.9 %  sodium chloride infusion, 500 mL, Intravenous, Once, Danis, Estill Cotta III, MD   Vital signs in last 24 hrs: Vitals:   09/01/19 1023  BP: 110/70  Pulse: 88  Temp: 99.2 F (37.3 C)    Physical Exam  Well-appearing  HEENT: sclera anicteric, oral mucosa moist without lesions  Neck: supple, no thyromegaly, JVD or lymphadenopathy  Cardiac: RRR without murmurs, S1S2 heard, no peripheral edema  Pulm: clear to auscultation bilaterally, normal RR and effort noted  Abdomen: soft, no tenderness, with active bowel sounds. No guarding or palpable hepatosplenomegaly.   Labs:   ___________________________________________ Radiologic studies:   ____________________________________________ Other: Small bowel biopsies as noted above  _____________________________________________ Assessment & Plan  Assessment: Encounter Diagnoses  Name Primary?  . 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 Irritable bowel syndrome without diarrhea   . Lactose intolerance    I think the ileal findings are most likely from NSAIDs.  The character of symptoms is not typical for Crohn's disease.  I think she has some mild underlying IBS and food intolerances including lactose intolerance.   Plan: Some written dietary advice given Offered a trial of hyoscyamine to take as needed, but she would prefer to wait and call me if she feels as necessary. See me as needed. If symptoms escalate, neck step will be CT enterography.   20 minutes were spent on this encounter (including chart review, history/exam, counseling/coordination of care, and documentation)  Nelida Meuse III

## 2019-09-09 DIAGNOSIS — F419 Anxiety disorder, unspecified: Secondary | ICD-10-CM | POA: Diagnosis not present

## 2019-09-22 DIAGNOSIS — F419 Anxiety disorder, unspecified: Secondary | ICD-10-CM | POA: Diagnosis not present

## 2019-10-06 DIAGNOSIS — F419 Anxiety disorder, unspecified: Secondary | ICD-10-CM | POA: Diagnosis not present

## 2019-10-20 DIAGNOSIS — F419 Anxiety disorder, unspecified: Secondary | ICD-10-CM | POA: Diagnosis not present

## 2019-11-03 DIAGNOSIS — F419 Anxiety disorder, unspecified: Secondary | ICD-10-CM | POA: Diagnosis not present

## 2019-12-01 DIAGNOSIS — F419 Anxiety disorder, unspecified: Secondary | ICD-10-CM | POA: Diagnosis not present

## 2019-12-23 ENCOUNTER — Telehealth: Payer: Self-pay | Admitting: Gastroenterology

## 2019-12-23 ENCOUNTER — Other Ambulatory Visit: Payer: Self-pay

## 2019-12-23 DIAGNOSIS — R109 Unspecified abdominal pain: Secondary | ICD-10-CM

## 2019-12-23 DIAGNOSIS — K625 Hemorrhage of anus and rectum: Secondary | ICD-10-CM

## 2019-12-23 DIAGNOSIS — F419 Anxiety disorder, unspecified: Secondary | ICD-10-CM | POA: Diagnosis not present

## 2019-12-23 NOTE — Telephone Encounter (Signed)
Spoke with patient, provided patient number to central scheduling so that she can get CT rescheduled at her convenience.

## 2019-12-23 NOTE — Telephone Encounter (Signed)
New order in epic. Scheduled CT enterography for 12/31/19 at 10:30 am with a 9 am arrival, NPO 4 hours prior. Patient aware of appt and restrictions. Advised to call office with any other questions.

## 2019-12-23 NOTE — Telephone Encounter (Signed)
Pt called to inform that she called central scheduling and was informed that order for CT expired, they need a new order so pt can schedule CT scan.

## 2019-12-29 DIAGNOSIS — F419 Anxiety disorder, unspecified: Secondary | ICD-10-CM | POA: Diagnosis not present

## 2019-12-30 ENCOUNTER — Telehealth: Payer: Self-pay | Admitting: Gastroenterology

## 2019-12-30 NOTE — Telephone Encounter (Signed)
Spoke with patient, patient is requesting CT scan at different facility. Scheduled patient for next available at [facility] on 01/13/20 at 3:20 pm, must arrive an hour and 15 mins early (2:05 pm), no solid foods 4 hours prior, liquids only.   Lm on vm for patient to return call for CT appt information.

## 2019-12-30 NOTE — Telephone Encounter (Signed)
Spoke with patient, pt aware of CT appt at [facility] imaging, arrival time and restrictions.

## 2019-12-31 ENCOUNTER — Ambulatory Visit (HOSPITAL_COMMUNITY): Admission: RE | Admit: 2019-12-31 | Payer: BC Managed Care – PPO | Source: Ambulatory Visit

## 2020-01-12 DIAGNOSIS — F419 Anxiety disorder, unspecified: Secondary | ICD-10-CM | POA: Diagnosis not present

## 2020-01-13 ENCOUNTER — Ambulatory Visit
Admission: RE | Admit: 2020-01-13 | Discharge: 2020-01-13 | Disposition: A | Discharge status: Home / Self Care | Payer: BC Managed Care – PPO | Source: Ambulatory Visit | Attending: Gastroenterology | Admitting: Gastroenterology

## 2020-01-13 ENCOUNTER — Other Ambulatory Visit: Payer: Self-pay

## 2020-01-13 DIAGNOSIS — R1030 Lower abdominal pain, unspecified: Secondary | ICD-10-CM | POA: Diagnosis not present

## 2020-01-13 DIAGNOSIS — R109 Unspecified abdominal pain: Secondary | ICD-10-CM

## 2020-01-13 DIAGNOSIS — K625 Hemorrhage of anus and rectum: Secondary | ICD-10-CM

## 2020-01-13 MED ORDER — IOPAMIDOL (ISOVUE-300) INJECTION 61%
100.0000 mL | Freq: Once | INTRAVENOUS | Status: AC | PRN
  Administered 2020-01-13: 100 mL via INTRAVENOUS

## 2020-01-18 ENCOUNTER — Telehealth: Payer: Self-pay | Admitting: Gastroenterology

## 2020-01-18 NOTE — Telephone Encounter (Signed)
Pt states she had a pretty bad episode last night with her symptoms she has been having. She saw her mychart message regarding her results. States she never got a prescription for hyoscyamine, states she told Dr. Loletha Carrow she wanted to hold off on meds. Pt wants to know if she should get that and if he feels it will help. Also asking if she should wait until the 22nd of Sept to be seen. Please advise.

## 2020-01-18 NOTE — Telephone Encounter (Signed)
Patient called requesting to speak with Dr. Loletha Carrow in reference to her CT scan and next steps

## 2020-01-19 MED ORDER — HYOSCYAMINE SULFATE 0.125 MG SL SUBL
0.1250 mg | SUBLINGUAL_TABLET | Freq: Four times a day (QID) | SUBLINGUAL | 0 refills | Status: AC | PRN
Start: 2020-01-19 — End: ?

## 2020-01-19 NOTE — Telephone Encounter (Signed)
Thanks for the clarification.  Given the ongoing symptoms, I would like her to try the hyoscyamine, and I will send a prescription for.  She can have a sooner appointment if there is one available, but unfortunately I am probably booked out that far at this point.

## 2020-01-19 NOTE — Telephone Encounter (Signed)
Spoke with pt and she is aware and will keep her OV as scheduled.

## 2020-01-28 DIAGNOSIS — L700 Acne vulgaris: Secondary | ICD-10-CM | POA: Diagnosis not present

## 2020-01-28 DIAGNOSIS — L309 Dermatitis, unspecified: Secondary | ICD-10-CM | POA: Diagnosis not present

## 2020-01-30 DIAGNOSIS — Z03818 Encounter for observation for suspected exposure to other biological agents ruled out: Secondary | ICD-10-CM | POA: Diagnosis not present

## 2020-01-30 DIAGNOSIS — Z20822 Contact with and (suspected) exposure to covid-19: Secondary | ICD-10-CM | POA: Diagnosis not present

## 2020-02-22 DIAGNOSIS — F419 Anxiety disorder, unspecified: Secondary | ICD-10-CM | POA: Diagnosis not present

## 2020-02-28 DIAGNOSIS — Z79899 Other long term (current) drug therapy: Secondary | ICD-10-CM | POA: Diagnosis not present

## 2020-02-28 DIAGNOSIS — L700 Acne vulgaris: Secondary | ICD-10-CM | POA: Diagnosis not present

## 2020-03-01 ENCOUNTER — Ambulatory Visit: Payer: BC Managed Care – PPO | Admitting: Gastroenterology

## 2020-03-05 ENCOUNTER — Other Ambulatory Visit: Payer: Self-pay | Admitting: Family Medicine

## 2020-03-05 DIAGNOSIS — F418 Other specified anxiety disorders: Secondary | ICD-10-CM

## 2020-03-05 NOTE — Telephone Encounter (Signed)
Requested medication (s) are due for refill today: yes  Requested medication (s) are on the active medication list: yes  Last refill:  11/29/2019  Future visit scheduled: no  Notes to clinic:  overdue for 6 month follow up   Requested Prescriptions  Pending Prescriptions Disp Refills   escitalopram (LEXAPRO) 10 MG tablet [Pharmacy Med Name: ESCITALOPRAM 10MG TABLETS] 90 tablet 1    Sig: TAKE 1 TABLET(10 MG) BY MOUTH DAILY      Psychiatry:  Antidepressants - SSRI Failed - 03/05/2020  7:14 PM      Failed - Valid encounter within last 6 months    Recent Outpatient Visits           9 months ago Depression with anxiety   Primary Care at [facility], Lilia Argue, MD              Passed - Completed PHQ-2 or PHQ-9 in the last 360 days.

## 2020-03-07 DIAGNOSIS — F419 Anxiety disorder, unspecified: Secondary | ICD-10-CM | POA: Diagnosis not present

## 2020-03-17 ENCOUNTER — Encounter: Payer: Self-pay | Admitting: Family Medicine

## 2020-03-17 ENCOUNTER — Telehealth (INDEPENDENT_AMBULATORY_CARE_PROVIDER_SITE_OTHER): Payer: BC Managed Care – PPO | Admitting: Family Medicine

## 2020-03-17 ENCOUNTER — Other Ambulatory Visit: Payer: Self-pay

## 2020-03-17 VITALS — Ht 64.0 in | Wt 170.0 lb

## 2020-03-17 DIAGNOSIS — R41840 Attention and concentration deficit: Secondary | ICD-10-CM

## 2020-03-17 DIAGNOSIS — F324 Major depressive disorder, single episode, in partial remission: Secondary | ICD-10-CM

## 2020-03-17 MED ORDER — ESCITALOPRAM OXALATE 10 MG PO TABS: ORAL_TABLET | ORAL | 1 refills | Status: AC

## 2020-03-17 NOTE — Progress Notes (Signed)
Virtual Visit Note  I connected with patient on 03/17/20 at 522pm by video epic and verified that I am speaking with the correct person using two identifiers. Marilyn Butler is currently located at home and patient is currently with them during visit. The provider, Rutherford Guys, MD is located in their office at time of visit.  I discussed the limitations, risks, security and privacy concerns of performing an evaluation and management service by telephone and the availability of in person appointments. I also discussed with the patient that there may be a patient responsible charge related to this service. The patient expressed understanding and agreed to proceed.   I provided 14 minutes of non-face-to-face time during this encounter.  Chief Complaint  Patient presents with  . ADHD    possible-concens     HPI ? 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 Authorizing Provider  escitalopram (LEXAPRO) 10 MG tablet TAKE 1 TABLET(10 MG) BY MOUTH DAILY 03/06/20  Yes Rutherford Guys, MD  etonogestrel (NEXPLANON) 68 MG IMPL implant 1 each by Subdermal route once.   Yes [provider]  hyoscyamine (LEVSIN SL) 0.125 MG SL tablet Place 1 tablet (0.125 mg total) under the tongue every 6 (six) hours as needed. Patient not taking: Reported on 03/17/2020 01/19/20   Doran Stabler, MD    Past Medical History:  Diagnosis Date  . Anxiety   . Depression   . Seizures ([place])    x1 after a fall/concussion    Past Surgical History:  Procedure Laterality Date  . WISDOM TOOTH EXTRACTION      Social History   Tobacco Use  . Smoking status: Never Smoker  . Smokeless tobacco: Never Used  Substance Use Topics  . Alcohol use: Yes    Comment: social    Family History  Problem Relation Age of Onset  . Depression Mother   . Diabetes Father   . Hypertension Father   . Crohn's disease Father   . Depression Sister   . Lung cancer Maternal Grandmother   . Depression Maternal Aunt   . Colon cancer Neg Hx   . Esophageal cancer Neg Hx   . Rectal cancer Neg Hx   . Stomach cancer Neg Hx     ROS  Objective  Vitals as reported by the patient:   ASSESSMENT and PLAN  1. Major depressive disorder with single episode, in partial remission (HCC) Stable. Cont current med and counseling - escitalopram (LEXAPRO) 10 MG tablet; TAKE 1 TABLET(10 MG) BY MOUTH DAILY  2. Poor concentration - Ambulatory referral to Psychiatry, [facility]  FOLLOW-UP: 6 months   The above assessment and management plan was discussed with the patient. The patient verbalized understanding of and has agreed to the management plan. Patient is aware to call the clinic if symptoms persist or worsen. Patient is aware  when to return to the clinic for a follow-up visit. Patient educated on when it is appropriate to go to the emergency department.     Rutherford Guys, MD Primary Care at [facility] [place], [place] [address] Ph.  [phone] Fax [phone]

## 2020-03-17 NOTE — Patient Instructions (Addendum)
   [facility] Address: [address], [place], [place] [address] Phone: [phone]  If you have lab work done today you will be contacted with your lab results within the next 2 weeks.  If you have not heard from [place] then please contact us. The fastest way to get your results is to register for My Chart.   IF you received an x-ray today, you will receive an invoice from [facility]. Please contact [facility] at [phone] with questions or concerns regarding your invoice.   IF you received labwork today, you will receive an invoice from [facility]. Please contact [facility] at [phone] with questions or concerns regarding your invoice.   Our billing staff will not be able to assist you with questions regarding bills from these companies.  You will be contacted with the lab results as soon as they are available. The fastest way to get your results is to activate your My Chart account. Instructions are located on the last page of this paperwork. If you have not heard from [place] regarding the results in 2 weeks, please contact this office.

## 2020-03-28 DIAGNOSIS — F419 Anxiety disorder, unspecified: Secondary | ICD-10-CM | POA: Diagnosis not present

## 2020-04-11 DIAGNOSIS — F419 Anxiety disorder, unspecified: Secondary | ICD-10-CM | POA: Diagnosis not present

## 2020-04-19 DIAGNOSIS — F419 Anxiety disorder, unspecified: Secondary | ICD-10-CM | POA: Diagnosis not present

## 2020-04-24 DIAGNOSIS — Z79899 Other long term (current) drug therapy: Secondary | ICD-10-CM | POA: Diagnosis not present

## 2020-04-24 DIAGNOSIS — L700 Acne vulgaris: Secondary | ICD-10-CM | POA: Diagnosis not present

## 2020-05-25 DIAGNOSIS — Z79899 Other long term (current) drug therapy: Secondary | ICD-10-CM | POA: Diagnosis not present

## 2020-05-25 DIAGNOSIS — L700 Acne vulgaris: Secondary | ICD-10-CM | POA: Diagnosis not present

## 2020-06-13 DIAGNOSIS — F419 Anxiety disorder, unspecified: Secondary | ICD-10-CM | POA: Diagnosis not present

## 2020-06-27 DIAGNOSIS — F419 Anxiety disorder, unspecified: Secondary | ICD-10-CM | POA: Diagnosis not present

## 2020-07-11 DIAGNOSIS — F419 Anxiety disorder, unspecified: Secondary | ICD-10-CM | POA: Diagnosis not present

## 2020-07-18 ENCOUNTER — Other Ambulatory Visit: Payer: Self-pay | Admitting: Family Medicine

## 2020-07-18 DIAGNOSIS — F324 Major depressive disorder, single episode, in partial remission: Secondary | ICD-10-CM

## 2020-07-18 NOTE — Telephone Encounter (Signed)
Copied from [place] [id number]. Topic: Quick Communication - Rx Refill/Question >> Jul 18, 2020  1:13 PM Mcneil, Ja-Kwan wrote: Medication: escitalopram (LEXAPRO) 10 MG tablet  Has the patient contacted their pharmacy? yes   Preferred Pharmacy (with phone number or street name): [facility] [place], [address] AT [address] Phone: [phone]   Fax: [phone]  Agent: Please be advised that RX refills may take up to 3 business days. We ask that you follow-up with your pharmacy.

## 2020-07-18 NOTE — Telephone Encounter (Signed)
    Notes to clinic:  Review for refill  Provider not at practice anymore    Requested Prescriptions  Pending Prescriptions Disp Refills   escitalopram (LEXAPRO) 10 MG tablet 90 tablet 1    Sig: TAKE 1 TABLET(10 MG) BY MOUTH DAILY      Psychiatry:  Antidepressants - SSRI Passed - 07/18/2020  1:17 PM      Passed - Completed PHQ-2 or PHQ-9 in the last 360 days      Passed - Valid encounter within last 6 months    Recent Outpatient Visits           4 months ago Major depressive disorder with single episode, in partial remission [facility])   Primary Care at [facility], Lilia Argue, MD   1 year ago Depression with anxiety   Primary Care at [facility], Lilia Argue, MD

## 2020-07-19 NOTE — Telephone Encounter (Signed)
Please schedule f/u appt for med refills. No Courtesy until appt is scheduled

## 2020-07-19 NOTE — Telephone Encounter (Signed)
Left message on patient's voicemail to request call back for scheduling appointment for med refill.

## 2020-07-24 DIAGNOSIS — F339 Major depressive disorder, recurrent, unspecified: Secondary | ICD-10-CM | POA: Diagnosis not present

## 2020-07-24 DIAGNOSIS — F900 Attention-deficit hyperactivity disorder, predominantly inattentive type: Secondary | ICD-10-CM | POA: Diagnosis not present

## 2020-07-24 DIAGNOSIS — F4310 Post-traumatic stress disorder, unspecified: Secondary | ICD-10-CM | POA: Diagnosis not present

## 2020-07-24 DIAGNOSIS — F411 Generalized anxiety disorder: Secondary | ICD-10-CM | POA: Diagnosis not present

## 2020-07-25 DIAGNOSIS — F419 Anxiety disorder, unspecified: Secondary | ICD-10-CM | POA: Diagnosis not present

## 2020-08-17 DIAGNOSIS — F419 Anxiety disorder, unspecified: Secondary | ICD-10-CM | POA: Diagnosis not present

## 2020-08-29 DIAGNOSIS — F411 Generalized anxiety disorder: Secondary | ICD-10-CM | POA: Diagnosis not present

## 2020-08-29 DIAGNOSIS — F4310 Post-traumatic stress disorder, unspecified: Secondary | ICD-10-CM | POA: Diagnosis not present

## 2020-08-29 DIAGNOSIS — F339 Major depressive disorder, recurrent, unspecified: Secondary | ICD-10-CM | POA: Diagnosis not present

## 2020-08-29 DIAGNOSIS — F4010 Social phobia, unspecified: Secondary | ICD-10-CM | POA: Diagnosis not present

## 2020-09-05 DIAGNOSIS — F419 Anxiety disorder, unspecified: Secondary | ICD-10-CM | POA: Diagnosis not present

## 2020-09-19 DIAGNOSIS — F419 Anxiety disorder, unspecified: Secondary | ICD-10-CM | POA: Diagnosis not present

## 2020-10-09 DIAGNOSIS — J329 Chronic sinusitis, unspecified: Secondary | ICD-10-CM | POA: Diagnosis not present

## 2020-10-09 DIAGNOSIS — F419 Anxiety disorder, unspecified: Secondary | ICD-10-CM | POA: Diagnosis not present

## 2020-10-09 DIAGNOSIS — R519 Headache, unspecified: Secondary | ICD-10-CM | POA: Diagnosis not present

## 2020-10-09 DIAGNOSIS — R0982 Postnasal drip: Secondary | ICD-10-CM | POA: Diagnosis not present

## 2020-10-09 DIAGNOSIS — R059 Cough, unspecified: Secondary | ICD-10-CM | POA: Diagnosis not present

## 2020-10-30 DIAGNOSIS — F419 Anxiety disorder, unspecified: Secondary | ICD-10-CM | POA: Diagnosis not present

## 2020-10-31 DIAGNOSIS — Z113 Encounter for screening for infections with a predominantly sexual mode of transmission: Secondary | ICD-10-CM | POA: Diagnosis not present

## 2020-10-31 DIAGNOSIS — Z124 Encounter for screening for malignant neoplasm of cervix: Secondary | ICD-10-CM | POA: Diagnosis not present

## 2020-10-31 DIAGNOSIS — Z719 Counseling, unspecified: Secondary | ICD-10-CM | POA: Diagnosis not present

## 2020-10-31 DIAGNOSIS — Z01419 Encounter for gynecological examination (general) (routine) without abnormal findings: Secondary | ICD-10-CM | POA: Diagnosis not present

## 2020-11-02 DIAGNOSIS — F4310 Post-traumatic stress disorder, unspecified: Secondary | ICD-10-CM | POA: Diagnosis not present

## 2020-11-02 DIAGNOSIS — F411 Generalized anxiety disorder: Secondary | ICD-10-CM | POA: Diagnosis not present

## 2020-11-02 DIAGNOSIS — F339 Major depressive disorder, recurrent, unspecified: Secondary | ICD-10-CM | POA: Diagnosis not present

## 2020-11-02 DIAGNOSIS — F900 Attention-deficit hyperactivity disorder, predominantly inattentive type: Secondary | ICD-10-CM | POA: Diagnosis not present

## 2020-11-14 DIAGNOSIS — F419 Anxiety disorder, unspecified: Secondary | ICD-10-CM | POA: Diagnosis not present

## 2020-11-28 DIAGNOSIS — F419 Anxiety disorder, unspecified: Secondary | ICD-10-CM | POA: Diagnosis not present

## 2020-12-12 DIAGNOSIS — F419 Anxiety disorder, unspecified: Secondary | ICD-10-CM | POA: Diagnosis not present

## 2020-12-14 DIAGNOSIS — F419 Anxiety disorder, unspecified: Secondary | ICD-10-CM | POA: Diagnosis not present

## 2020-12-14 DIAGNOSIS — L700 Acne vulgaris: Secondary | ICD-10-CM | POA: Diagnosis not present

## 2020-12-14 DIAGNOSIS — K921 Melena: Secondary | ICD-10-CM | POA: Diagnosis not present

## 2020-12-14 DIAGNOSIS — F32A Depression, unspecified: Secondary | ICD-10-CM | POA: Diagnosis not present

## 2020-12-26 DIAGNOSIS — F419 Anxiety disorder, unspecified: Secondary | ICD-10-CM | POA: Diagnosis not present

## 2021-01-11 DIAGNOSIS — F419 Anxiety disorder, unspecified: Secondary | ICD-10-CM | POA: Diagnosis not present

## 2021-01-24 DIAGNOSIS — F419 Anxiety disorder, unspecified: Secondary | ICD-10-CM | POA: Diagnosis not present

## 2021-02-12 DIAGNOSIS — S61307A Unspecified open wound of left little finger with damage to nail, initial encounter: Secondary | ICD-10-CM | POA: Diagnosis not present

## 2021-02-12 DIAGNOSIS — Z23 Encounter for immunization: Secondary | ICD-10-CM | POA: Diagnosis not present

## 2021-02-20 DIAGNOSIS — F419 Anxiety disorder, unspecified: Secondary | ICD-10-CM | POA: Diagnosis not present

## 2021-03-06 DIAGNOSIS — F419 Anxiety disorder, unspecified: Secondary | ICD-10-CM | POA: Diagnosis not present

## 2021-03-20 DIAGNOSIS — F419 Anxiety disorder, unspecified: Secondary | ICD-10-CM | POA: Diagnosis not present

## 2021-04-13 DIAGNOSIS — F419 Anxiety disorder, unspecified: Secondary | ICD-10-CM | POA: Diagnosis not present

## 2021-04-28 DIAGNOSIS — F419 Anxiety disorder, unspecified: Secondary | ICD-10-CM | POA: Diagnosis not present

## 2021-05-14 DIAGNOSIS — F419 Anxiety disorder, unspecified: Secondary | ICD-10-CM | POA: Diagnosis not present

## 2021-05-25 DIAGNOSIS — F419 Anxiety disorder, unspecified: Secondary | ICD-10-CM | POA: Diagnosis not present

## 2021-10-04 IMAGING — CT CT ENTEROGRAPHY (ABD-PELV W/ CM)
1 of 3 series · 14 of 32 positions shown, 19 images · IV contrast (iopamidol)
Comparison: None.

CLINICAL DATA: Lower abdominal pain.  Blood in stool.

EXAM:
CT ABDOMEN AND PELVIS WITH CONTRAST (ENTEROGRAPHY)
TECHNIQUE: Multidetector CT of the abdomen and pelvis during bolus
administration of intravenous contrast. Negative oral contrast was
given.
CONTRAST:  100mL 91M268-7PP IOPAMIDOL (91M268-7PP) INJECTION 61%

[Series 3: enterography thins 2mm · axial · 0.84mm/px · z∈[-471,-55]mm · 14 of 232 slices shown, 19 images]
[im 12/232  soft-tissue]
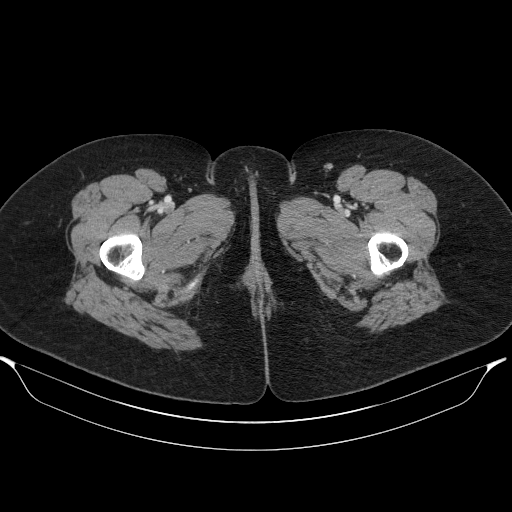
[im 12/232  bone]
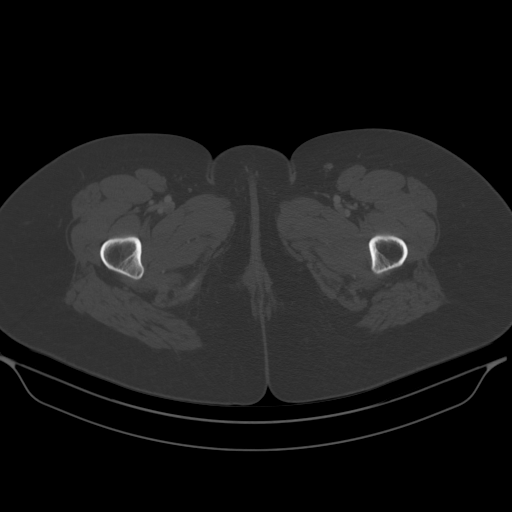
[im 35/232  soft-tissue]
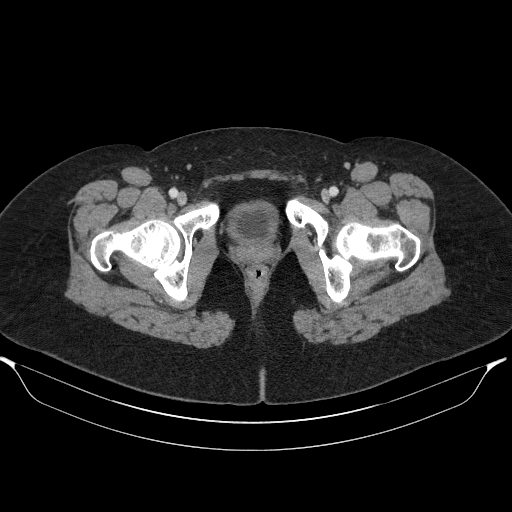
[im 47/232  soft-tissue]
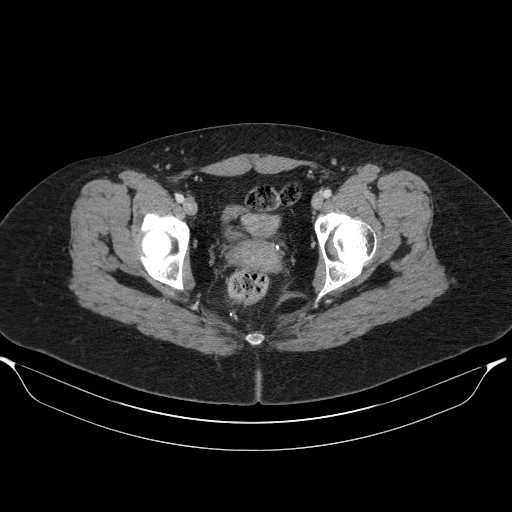
[im 70/232  soft-tissue]
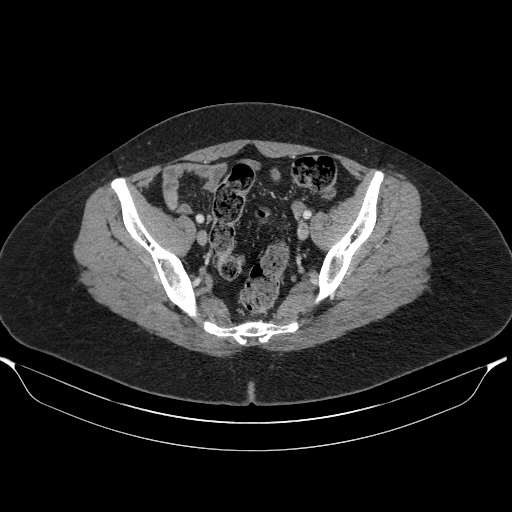
[im 81/232  soft-tissue]
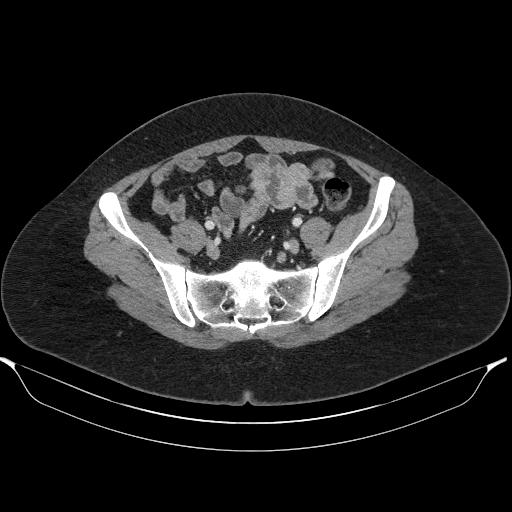
[im 104/232  soft-tissue]
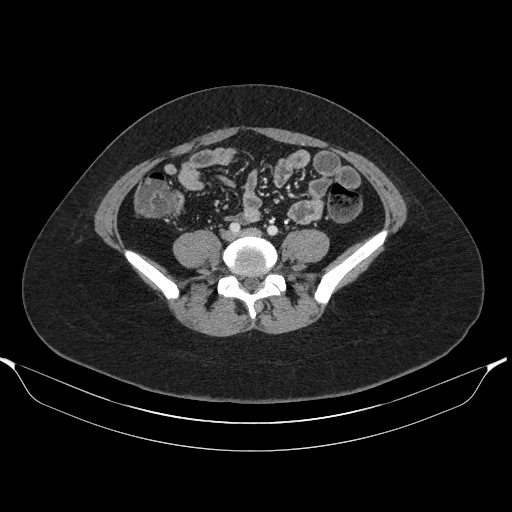
[im 116/232  soft-tissue]
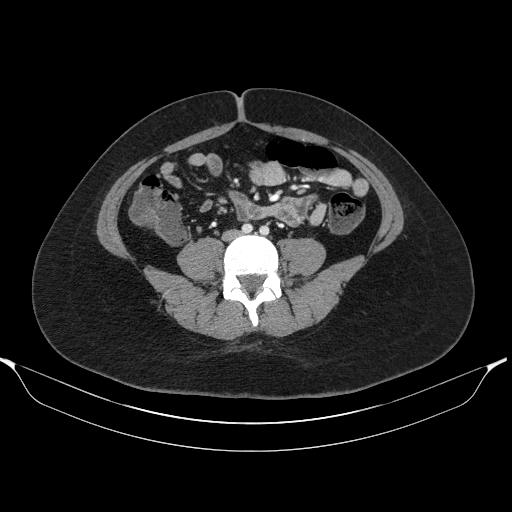
[im 128/232  soft-tissue]
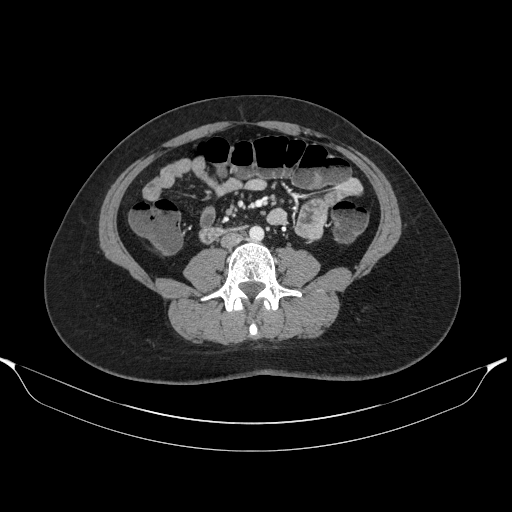
[im 151/232  soft-tissue]
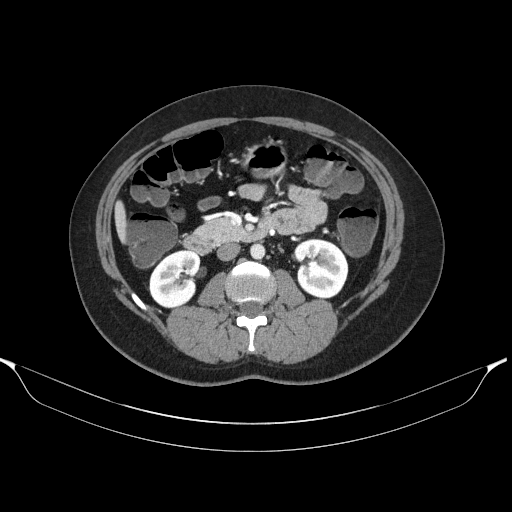
[im 151/232  bone]
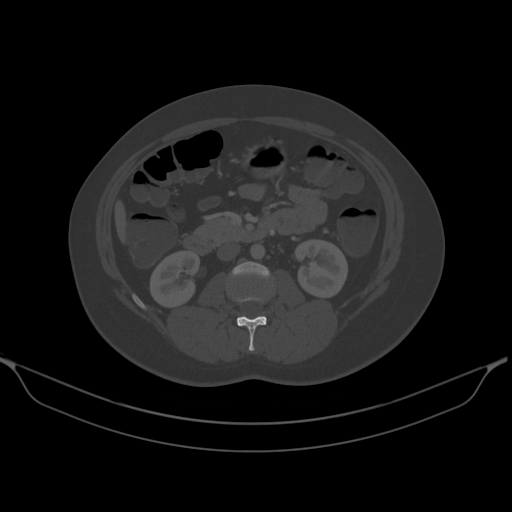
[im 162/232  soft-tissue]
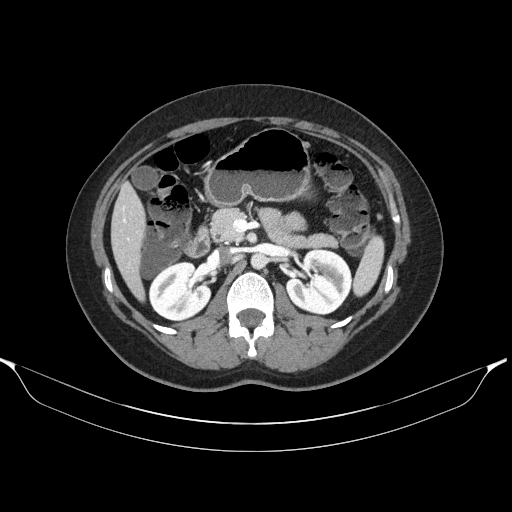
[im 185/232  soft-tissue]
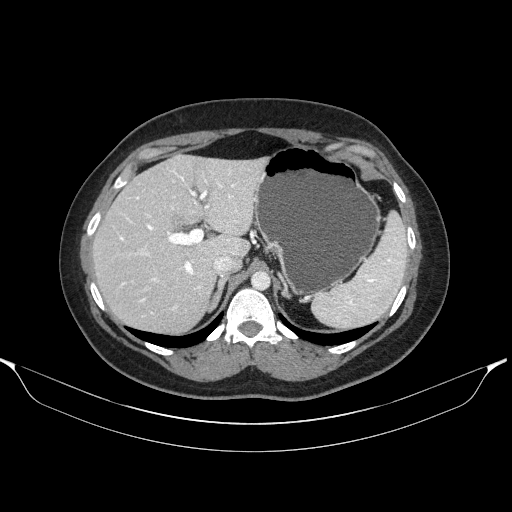
[im 185/232  lung]
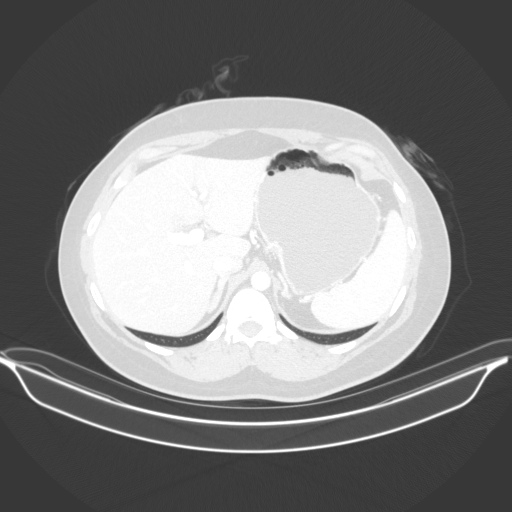
[im 197/232  soft-tissue]
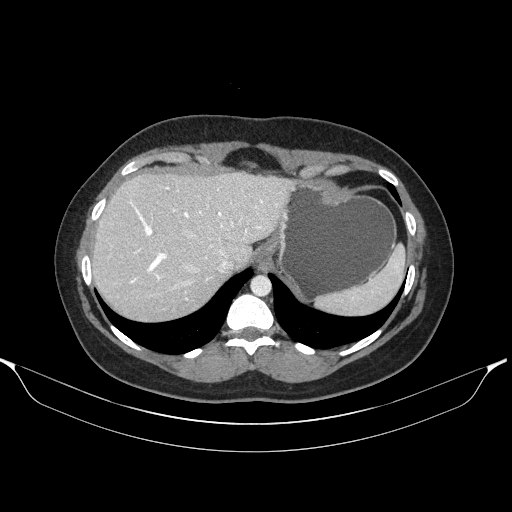
[im 197/232  lung]
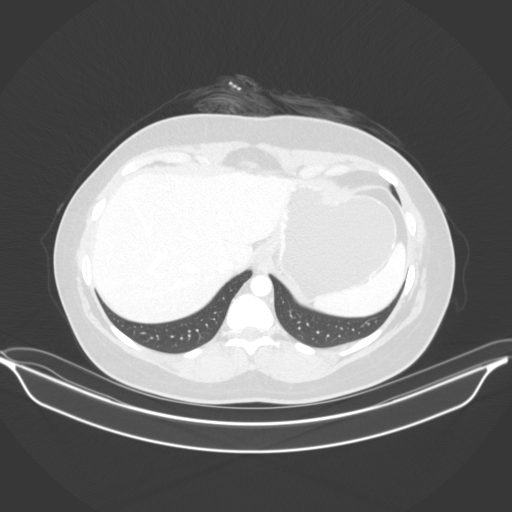
[im 208/232  lung]
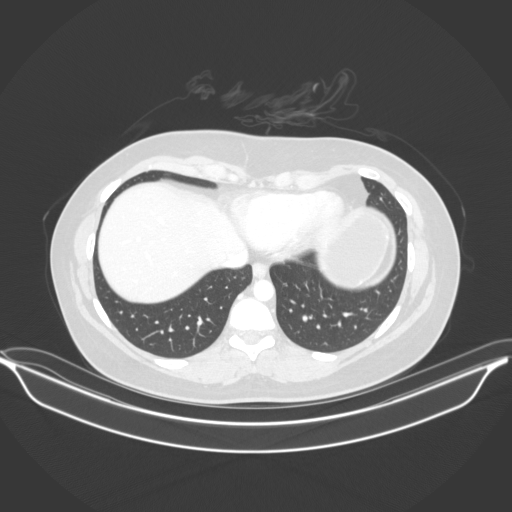
[im 220/232  soft-tissue]
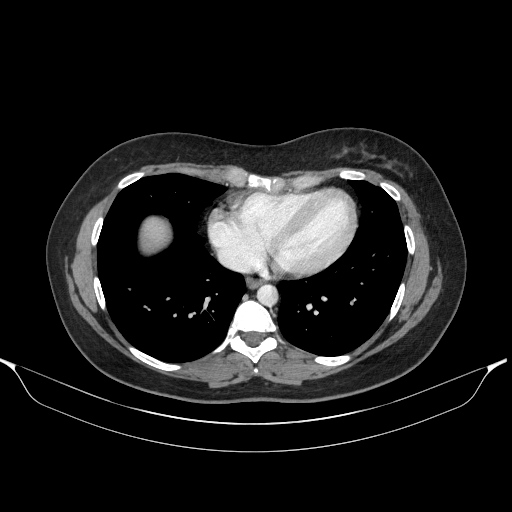
[im 220/232  lung]
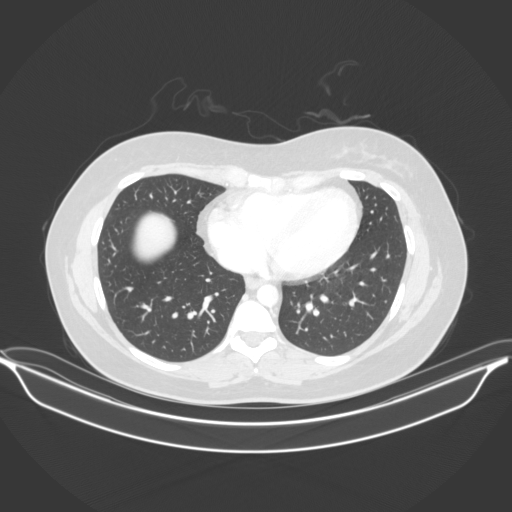

[14 of 32 positions shown; findings below may reference images not displayed]

FINDINGS: Lower Chest: No acute findings.

Hepatobiliary: A sub-cm flash-filling hypervascular lesion is seen
in the posterior right hepatic lobe, most consistent with a tiny
flash-filling hemangioma. No suspicious liver lesions identified.
Gallbladder is unremarkable. No evidence of biliary ductal
dilatation.

Pancreas:  No mass or inflammatory changes.

Spleen: Within normal limits in size and appearance.

Adrenals/Urinary Tract: No masses identified. No evidence of
ureteral calculi or hydronephrosis.

Stomach/Bowel: No evidence of bowel wall thickening, abnormal
contrast enhancement, or dilatation. No evidence of mesenteric
inflammatory changes, enteric fistula, or abnormal fluid
collections. The terminal ileum is normal in appearance. Normal
appendix visualized.

Vascular/Lymphatic: No pathologically enlarged lymph nodes. No
abdominal aortic aneurysm.

Reproductive:  No mass or other significant abnormality.

Other:  None.

Musculoskeletal:  No suspicious bone lesions identified.
IMPRESSION: No radiographic evidence of inflammatory bowel disease or other
significant abnormality.
